# Patient Record
Sex: Male | Born: 1995 | Race: White | Hispanic: No | Marital: Single | Smoking: Never smoker
Health system: Southern US, Community
[De-identification: ages and names within clinical notes are randomized; demographics above are authoritative.]

---

## 2020-02-06 ENCOUNTER — Other Ambulatory Visit: Payer: Self-pay

## 2020-02-06 ENCOUNTER — Encounter (HOSPITAL_COMMUNITY): Payer: Self-pay | Admitting: Behavioral Health

## 2020-02-06 ENCOUNTER — Inpatient Hospital Stay (HOSPITAL_COMMUNITY)
Admission: RE | Admit: 2020-02-06 | Discharge: 2020-02-09 | DRG: 885 | Disposition: A | Discharge status: Home / Self Care | Payer: Medicaid Other | Attending: Psychiatry | Admitting: Psychiatry

## 2020-02-06 DIAGNOSIS — F259 Schizoaffective disorder, unspecified: Secondary | ICD-10-CM | POA: Diagnosis present

## 2020-02-06 DIAGNOSIS — Z20822 Contact with and (suspected) exposure to covid-19: Secondary | ICD-10-CM | POA: Diagnosis present

## 2020-02-06 DIAGNOSIS — B1920 Unspecified viral hepatitis C without hepatic coma: Secondary | ICD-10-CM | POA: Diagnosis present

## 2020-02-06 DIAGNOSIS — R45851 Suicidal ideations: Secondary | ICD-10-CM | POA: Diagnosis present

## 2020-02-06 DIAGNOSIS — R7401 Elevation of levels of liver transaminase levels: Secondary | ICD-10-CM

## 2020-02-06 DIAGNOSIS — Z886 Allergy status to analgesic agent status: Secondary | ICD-10-CM

## 2020-02-06 DIAGNOSIS — F332 Major depressive disorder, recurrent severe without psychotic features: Principal | ICD-10-CM | POA: Diagnosis present

## 2020-02-06 DIAGNOSIS — G4700 Insomnia, unspecified: Secondary | ICD-10-CM | POA: Diagnosis present

## 2020-02-06 LAB — RESPIRATORY PANEL BY RT PCR (FLU A&B, COVID)
Influenza A by PCR: NEGATIVE
Influenza B by PCR: NEGATIVE
SARS Coronavirus 2 by RT PCR: NEGATIVE

## 2020-02-06 MED ORDER — ACETAMINOPHEN 325 MG PO TABS: 650.0000 mg | ORAL_TABLET | Freq: Four times a day (QID) | ORAL | Status: DC | PRN

## 2020-02-06 MED ORDER — TRAZODONE HCL 50 MG PO TABS: 50.0000 mg | ORAL_TABLET | Freq: Every evening | ORAL | Status: DC | PRN

## 2020-02-06 MED ORDER — ALUM & MAG HYDROXIDE-SIMETH 200-200-20 MG/5ML PO SUSP: 30.0000 mL | ORAL | Status: DC | PRN

## 2020-02-06 MED ORDER — OLANZAPINE 10 MG PO TABS
20.0000 mg | ORAL_TABLET | Freq: Every day | ORAL | Status: DC
  Administered 2020-02-06: 20 mg via ORAL
  Filled 2020-02-06 (×3): qty 2

## 2020-02-06 MED ORDER — HYDROXYZINE HCL 50 MG PO TABS
50.0000 mg | ORAL_TABLET | Freq: Once | ORAL | Status: AC
  Administered 2020-02-06: 22:00:00 50 mg via ORAL

## 2020-02-06 MED ORDER — MIRTAZAPINE 15 MG PO TABS
15.0000 mg | ORAL_TABLET | Freq: Every day | ORAL | Status: DC
  Administered 2020-02-06: 15 mg via ORAL
  Filled 2020-02-06 (×3): qty 1

## 2020-02-06 MED ORDER — MAGNESIUM HYDROXIDE 400 MG/5ML PO SUSP: 30.0000 mL | Freq: Every day | ORAL | Status: DC | PRN

## 2020-02-06 MED ORDER — HYDROXYZINE HCL 25 MG PO TABS: 25.0000 mg | ORAL_TABLET | Freq: Four times a day (QID) | ORAL | Status: DC | PRN

## 2020-02-06 MED ORDER — BUSPIRONE HCL 5 MG PO TABS
5.0000 mg | ORAL_TABLET | Freq: Every morning | ORAL | Status: DC
  Administered 2020-02-07: 5 mg via ORAL
  Filled 2020-02-06 (×4): qty 1
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
## 2020-02-06 NOTE — Progress Notes (Signed)
Patient ID: Larry Donovan, male   DOB: 06-25-1996, 24 y.o.   MRN: [id number] Pt A&O x 4, presents with SI, plan to overdose on Heroin.  Complaint of anxiety and racing thoughts also.  Positive AVH noted, seeing dark shadows and radio like voices.  Skin search completed, monitoring for safety.  Pt has a ankle monitor intact, currently living at the [facility]  Pt anxious and cooperative.  No distress noted.
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
## 2020-02-07 DIAGNOSIS — G4700 Insomnia, unspecified: Secondary | ICD-10-CM | POA: Diagnosis present

## 2020-02-07 DIAGNOSIS — F333 Major depressive disorder, recurrent, severe with psychotic symptoms: Secondary | ICD-10-CM | POA: Diagnosis not present

## 2020-02-07 DIAGNOSIS — R45851 Suicidal ideations: Secondary | ICD-10-CM | POA: Diagnosis present

## 2020-02-07 DIAGNOSIS — Z886 Allergy status to analgesic agent status: Secondary | ICD-10-CM | POA: Diagnosis not present

## 2020-02-07 DIAGNOSIS — F259 Schizoaffective disorder, unspecified: Secondary | ICD-10-CM | POA: Diagnosis present

## 2020-02-07 DIAGNOSIS — Z20822 Contact with and (suspected) exposure to covid-19: Secondary | ICD-10-CM | POA: Diagnosis present

## 2020-02-07 DIAGNOSIS — F250 Schizoaffective disorder, bipolar type: Secondary | ICD-10-CM | POA: Diagnosis not present

## 2020-02-07 DIAGNOSIS — B1920 Unspecified viral hepatitis C without hepatic coma: Secondary | ICD-10-CM | POA: Diagnosis present

## 2020-02-07 DIAGNOSIS — F332 Major depressive disorder, recurrent severe without psychotic features: Secondary | ICD-10-CM | POA: Diagnosis present

## 2020-02-07 LAB — COMPREHENSIVE METABOLIC PANEL
ALT: 2144 U/L — ABNORMAL HIGH (ref 0–44)
AST: 1249 U/L — ABNORMAL HIGH (ref 15–41)
Albumin: 4.2 g/dL (ref 3.5–5.0)
Alkaline Phosphatase: 187 U/L — ABNORMAL HIGH (ref 38–126)
Anion gap: 9 (ref 5–15)
BUN: 16 mg/dL (ref 6–20)
CO2: 23 mmol/L (ref 22–32)
Calcium: 9.5 mg/dL (ref 8.9–10.3)
Chloride: 110 mmol/L (ref 98–111)
Creatinine, Ser: 0.61 mg/dL (ref 0.61–1.24)
GFR calc Af Amer: >60 mL/min (ref >60)
GFR calc non Af Amer: >60 mL/min (ref >60)
Glucose, Bld: 102 mg/dL — ABNORMAL HIGH (ref 70–99)
Potassium: 4.2 mmol/L (ref 3.5–5.1)
Sodium: 142 mmol/L (ref 135–145)
Total Bilirubin: 1.4 mg/dL — ABNORMAL HIGH (ref 0.3–1.2)
Total Protein: 7.4 g/dL (ref 6.5–8.1)

## 2020-02-07 LAB — CBC
HCT: 45.9 % (ref 39.0–52.0)
Hemoglobin: 15.2 g/dL (ref 13.0–17.0)
MCH: 31.3 pg (ref 26.0–34.0)
MCHC: 33.1 g/dL (ref 30.0–36.0)
MCV: 94.6 fL (ref 80.0–100.0)
Platelets: 236 10*3/uL (ref 150–400)
RBC: 4.85 MIL/uL (ref 4.22–5.81)
RDW: 12 % (ref 11.5–15.5)
WBC: 5.8 10*3/uL (ref 4.0–10.5)
nRBC: 0 % (ref 0.0–0.2)

## 2020-02-07 MED ORDER — LORAZEPAM 1 MG PO TABS: 1.0000 mg | ORAL_TABLET | ORAL | Status: DC | PRN

## 2020-02-07 MED ORDER — OLANZAPINE 5 MG PO TBDP: 5.0000 mg | ORAL_TABLET | Freq: Three times a day (TID) | ORAL | Status: DC | PRN

## 2020-02-07 NOTE — Progress Notes (Signed)
Pt reports ongoing depression and anxiety today. He endorses AVH, seeing figures and hearing random voices.  He endorses passive SI but denies having active SI during the assessment. He is able to contract for safety and agrees to speak with staff if he begins feeling suicidal. Pt denies HI.   Orders reviewed. V/s assessed. 15 minute checks performed for safety. Pt encouraged to attend groups.   Pt compliant with tx plan.

## 2020-02-07 NOTE — Progress Notes (Signed)
Pt made aware he's NPO after midnight tonight.  Pt refused labs this evening.

## 2020-02-07 NOTE — Progress Notes (Signed)
Patient oriented to adult unit after height, weight and belongings search. Patient was polite but sleepy. Safety maintained with 15 min checks.

## 2020-02-07 NOTE — [facility] Suicide Risk Assessment (Addendum)
[facility] Admission Suicide Risk Assessment   Nursing information obtained from:  Patient, Review of record Demographic factors:  Male, Caucasian, Adolescent or young adult Current Mental Status:  Suicidal ideation indicated by patient Loss Factors:  Legal issues Historical Factors:  Prior suicide attempts, Impulsivity Risk Reduction Factors:  Positive social support, Employed, Positive therapeutic relationship, Sense of responsibility to family, Living with another person, especially a relative, Positive coping skills or problem solving skills  Total Time spent with patient: 45 minutes Principal Problem: Schizoaffective Disorder  Diagnosis:  Active Problems:   Schizoaffective disorder ([place])   MDD (major depressive disorder), recurrent episode, severe ([place])  Subjective Data:  Continued Clinical Symptoms:  Alcohol Use Disorder Identification Test Final Score (AUDIT): 0 The "Alcohol Use Disorders Identification Test", Guidelines for Use in Primary Care, Second Edition.  World Pharmacologist [facility]). Score between 0-7:  no or low risk or alcohol related problems. Score between 8-15:  moderate risk of alcohol related problems. Score between 16-19:  high risk of alcohol related problems. Score 20 or above:  warrants further diagnostic evaluation for alcohol dependence and treatment.   CLINICAL FACTORS:  23, single, currently in an [place]  , has a 1 year old child. Works for a Runner, broadcasting/film/video. Reports he was recently released from jail ( Feb 25 )  He presented to [facility] on 4/2 via [facility], which he states he had contacted himself . He reports he had been feeling depressed and had developed suicidal ideations of overdosing on medications. He also reports auditory hallucinations ( reports voices which he cannot make out but which he feels are talking about him) , he also reports seeing shadows .  Describes depression and hallucinations have been chronic, but seem to have exacerbated over the last  few days, particularly yesterday. He reports he has been " under a lot of stress" related to job and to not being able to see his child. Endorses neuro-vegetative symptoms - poor energy, hypersomnia, low motivation, anhedonia Reports history of psychiatric illness and states he has been diagnosed with Schizoaffective Disorder. Reports past history of suicide attempt by overdosing on Heroin. Denies history of self cutting.  Denies medical illnesses. Allergic to Ibuprofen.  Reports he had been prescribed Zyprexa ( 20 mgrs QDAY), Remeron ( 15 mgrs QHS) , Buspar ( 5 mgrs BID). States he has been on these medications for about 8 months, without side effects, but had been off them for about two weeks.  History of opiate ( heroin) dependence, but reports he has not relapsed , last used about a year ago. Denies alcohol or other drug abuse .  * Labs reviewed. Of note AST and ALT as quite elevated ( 1,249 and 2,144) . Total Bili 1.4 CBC unremarkable.  I reviewed this issue with patient- denies known history of hepatic illness . Denies recent use or abuse or acetaminophen, denies any recent overdose . Denies Alcohol Abuse. Other than some choluria, does not endorse other associated symptoms- no acholia, no RUQ or abdominal pain reported at this time, no vomiting, and does not appear jaundiced or toxic . He is afebrile . I have reviewed with hospitalist consultant . Recommends RUQ sonogram and Viral Hepatitis Panel   Dx-  Schizoaffective Disorder . 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  *It is unlikely that his psychiatric medications ( Zyprexa, Remeron, Buspar ) are causing or contributing to elevated transaminases, and  states he has been taking these medications for several months now  without side effects . However , Zyprexa, Remeron can ( rarely) be associated with hepatotoxicity.  Will therefore hold standing medications for now. Zyprexa/Ativan PRN for anxiety or agitation .   RUQ sonogram Viral Hepatitis Panel - patient also agrees to HIV test Repeat Hepatic Function Test in AM    Musculoskeletal: Strength & Muscle Tone: within normal limits Gait & Station: normal Patient leans: N/A  Psychiatric Specialty Exam: Physical Exam  Review of Systems reports headache, no chest pain, no shortness of breath, no vomiting, no rash .  Reports some choluria, but no acholia, no abdominal pain, no vomiting   Blood pressure 128/79, pulse (!) 135, temperature 98.4 F (36.9 C), temperature source Oral, resp. rate 20, height 5\' 10"  (1.778 m), weight 76.2 kg, SpO2 99 %.Body mass index is 24.11 kg/m.  General Appearance: Fairly Groomed  Eye Contact:  Good  Speech:  Normal Rate  Volume:  Normal  Mood:  Depressed describes mood as 3/10 withy 10 being best  Affect:  constricted , slightly anxious   Thought Process:  Linear and Descriptions of Associations: Intact  Orientation:  Full (Time, Place, and Person)  Thought Content:  reports auditory and visual hallucinations, does not currently present internally preoccupied  No delusions are expressed   Suicidal Thoughts:  No denies current suicidal or self injurious ideations, contracts for safety on the unit,   Homicidal Thoughts:  No  Memory:  recent and remote grossly intact   Judgement:  Fair  Insight:  Fair  Psychomotor Activity:  Normal- no tremors, no diaphoresis, no restlessness or agitation  Concentration:  Concentration: Good and Attention Span: Good  Recall:  Good  Fund of Knowledge:  Good  Language:  Good  Akathisia:  Negative  Handed:  Right  AIMS (if indicated):   no abnormal or involuntary movements noted or reported . No cog wheeling or stiffness noted   Assets:  Communication Skills Desire for Improvement Resilience  ADL's:  Intact  Cognition:  WNL  Sleep:  Number of Hours: 2.75      COGNITIVE FEATURES THAT CONTRIBUTE TO RISK:  Closed-mindedness and Loss of executive function    SUICIDE RISK:    Moderate:  Frequent suicidal ideation with limited intensity, and duration, some specificity in terms of plans, no associated intent, good self-control, limited dysphoria/symptomatology, some risk factors present, and identifiable protective factors, including available and accessible social support.  PLAN OF CARE: Patient will be admitted to inpatient psychiatric unit for stabilization and safety. Will provide and encourage milieu participation. Provide medication management and maked adjustments as needed.  Will follow daily.    I certify that inpatient services furnished can reasonably be expected to improve the patient's condition.   , MD 02/07/2020, 1:56 PM

## 2020-02-08 ENCOUNTER — Inpatient Hospital Stay (HOSPITAL_COMMUNITY): Payer: Medicaid Other

## 2020-02-08 DIAGNOSIS — F333 Major depressive disorder, recurrent, severe with psychotic symptoms: Secondary | ICD-10-CM

## 2020-02-08 LAB — HEPATIC FUNCTION PANEL
ALT: 2100 U/L — ABNORMAL HIGH (ref 0–44)
AST: 882 U/L — ABNORMAL HIGH (ref 15–41)
Albumin: 4.2 g/dL (ref 3.5–5.0)
Alkaline Phosphatase: 204 U/L — ABNORMAL HIGH (ref 38–126)
Bilirubin, Direct: 0.5 mg/dL — ABNORMAL HIGH (ref 0.0–0.2)
Indirect Bilirubin: 0.7 mg/dL (ref 0.3–0.9)
Total Bilirubin: 1.2 mg/dL (ref 0.3–1.2)
Total Protein: 7.6 g/dL (ref 6.5–8.1)

## 2020-02-08 LAB — HEPATITIS PANEL, ACUTE
HCV Ab: REACTIVE — AB
Hep A IgM: NONREACTIVE
Hep B C IgM: NONREACTIVE
Hepatitis B Surface Ag: NONREACTIVE

## 2020-02-08 LAB — TSH: TSH: 0.576 u[IU]/mL (ref 0.350–4.500)

## 2020-02-08 MED ORDER — OLANZAPINE 10 MG PO TABS
10.0000 mg | ORAL_TABLET | Freq: Every day | ORAL | Status: DC
  Administered 2020-02-08: 21:00:00 10 mg via ORAL
  Filled 2020-02-08 (×3): qty 1

## 2020-02-08 MED ORDER — NICOTINE 21 MG/24HR TD PT24
21.0000 mg | MEDICATED_PATCH | Freq: Every day | TRANSDERMAL | Status: DC
  Administered 2020-02-08: 15:00:00 21 mg via TRANSDERMAL
  Filled 2020-02-08 (×4): qty 1

## 2020-02-08 MED ORDER — MIRTAZAPINE 15 MG PO TABS
15.0000 mg | ORAL_TABLET | Freq: Every day | ORAL | Status: DC
  Administered 2020-02-08: 15 mg via ORAL
  Filled 2020-02-08 (×3): qty 1

## 2020-02-08 NOTE — Progress Notes (Signed)
   02/07/20 2200  COVID-19 Daily Checkoff  Have you had a fever (temp > 37.80C/100F)  in the past 24 hours?  No  If you have had runny nose, nasal congestion, sneezing in the past 24 hours, has it worsened? No  COVID-19 EXPOSURE  Have you traveled outside the state in the past 14 days? No  Have you been in contact with someone with a confirmed diagnosis of COVID-19 or PUI in the past 14 days without wearing appropriate PPE? No  Have you been living in the same home as a person with confirmed diagnosis of COVID-19 or a PUI (household contact)? No  Have you been diagnosed with COVID-19? No

## 2020-02-08 NOTE — Progress Notes (Signed)
Patient has been observed up in the dayroom with peers laughing, talking and watching tv. He has been up and active more tonight. He was informed of his medications for tonight and inquired about vistaril because it helps him rest better. Writer suggested that he speak with his doctor if medicines prescribed don't work. Safety maintained with 15 min checks.

## 2020-02-08 NOTE — Progress Notes (Signed)
   02/08/20 0900  Psych Admission Type (Psych Patients Only)  Admission Status Voluntary  Psychosocial Assessment  Patient Complaints Anxiety  Eye Contact Fair  Facial Expression Anxious  Affect Anxious  Speech Logical/coherent  Interaction Assertive  Motor Activity Slow  Appearance/Hygiene Unremarkable  Behavior Characteristics Appropriate to situation;Anxious  Mood Anxious  Thought Process  Coherency WDL  Content WDL  Delusions None reported or observed  Perception Hallucinations  Hallucination Auditory;Visual  Judgment WDL  Confusion None  Danger to Self  Current suicidal ideation? Passive  Self-Injurious Behavior No self-injurious ideation or behavior indicators observed or expressed   Danger to Others  Danger to Others None reported or observed

## 2020-02-08 NOTE — [facility] Suicide Risk Assessment (Signed)
[facility] INPATIENT:  Family/Significant Other Suicide Prevention Education  Suicide Prevention Education:  Education Completed; mother Festus Barren [phone],  (name of family member/significant other) has been identified by the patient as the family member/significant other with whom the patient will be residing, and identified as the person(s) who will aid the patient in the event of a mental health crisis (suicidal ideations/suicide attempt).  With written consent from the patient, the family member/significant other has been provided the following suicide prevention education, prior to and/or following the discharge of the patient.  Mother was not aware that patient is in the hospital, despite having talked with him by phone this morning.  He gave CSW written consent to talk with her and knew this phone call would be made as well.  Mother stated that patient has Schizoaffective disorder, a mood disorder, and anxiety, just as she does.  She has been telling him for years to take his medications and to keep going back to his doctor until he is put on the right medicines that work.  She states she tells him:   "Nothing will make it go away but we can get a grip on the symptoms if we get on the proper medications."   She herself takes Haldol, Hyroxyzine, Xanax, and Invega.  Patient is not allowed at mother's apartment complex because of his past behavior there.  This is the first time everyone in his family has separated themselves from him because of his behaviors.  He has overdosed on heroin 2 times.  She is glad to hear that he told CSW he is clean at this time.  She is proud of him for being in the hospital.   The suicide prevention education provided includes the following:  Suicide risk factors  Suicide prevention and interventions  National Suicide Hotline telephone number  [facility] assessment telephone number  [facility] Emergency Assistance 911  [facility]  and/or Residential Mobile Crisis Unit telephone number  Request made of family/significant other to:  Remove weapons (e.g., guns, rifles, knives), all items previously/currently identified as safety concern.    Remove drugs/medications (over-the-counter, prescriptions, illicit drugs), all items previously/currently identified as a safety concern.  The family member/significant other verbalizes understanding of the suicide prevention education information provided.  The family member/significant other agrees to remove the items of safety concern listed above.  Carloyn Jaeger Grossman-Orr 02/08/2020, 2:16 PM

## 2020-02-08 NOTE — Progress Notes (Signed)
[facility] MD Progress Note  02/08/2020 10:07 AM Larry Donovan  MRN:  [id number] Subjective:  "I'm better than I was."  Larry Donovan found lying in bed. He reports mood is improved from yesterday but continues to report depressed mood with suicidal thoughts with no plan. He reports AH of voices that are unintelligible but later reports hearing CAH to kill himself. He denie sHI. He reports feeling "tired and restless." He denies withdrawal symptoms and denies recent drug/alcohol use. UDS was not obtained on admission. He has history of heroin use but states he has been sober for months. He reports poor sleep overnight related to being off medications. Nursing staff reports he slept throughout the night. Medications were discontinued yesterday due to elevated LFTs. LFTs trending down today, with AST now 882, ALT 2100. Hepatitis panel pending. Liver ultrasound done this morning shows gallbladder mildly contracted, no gallstones or pericholecystic fluid, with normal appearing liver by ultrasound.  From admission H&P: He presented to [facility] on 4/2 via [employer], which he states he had contacted himself . He reports he had been feeling depressed and had developed suicidal ideations of overdosing on medications. He also reports auditory hallucinations ( reports voices which he cannot make out but which he feels are talking about him) , he also reports seeing shadows .   Principal Problem: <principal problem not specified> Diagnosis: Active Problems:   Schizoaffective disorder ([facility])   MDD (major depressive disorder), recurrent episode, severe ([facility])  Total Time spent with patient: 15 minutes  Past Psychiatric History: See admission H&P  Past Medical History: History reviewed. No pertinent past medical history. History reviewed. No pertinent surgical history. Family History: History reviewed. No pertinent family history. Family Psychiatric  History: See admission H&P Social History:  Social History   Substance and  Sexual Activity  Alcohol Use None     Social History   Substance and Sexual Activity  Drug Use Not on file    Social History   Socioeconomic History  . Marital status: Single    Spouse name: Not on file  . Number of children: Not on file  . Years of education: Not on file  . Highest education level: Not on file  Occupational History  . Not on file  Tobacco Use  . Smoking status: Never Smoker  . Smokeless tobacco: Never Used  Substance and Sexual Activity  . Alcohol use: Not on file  . Drug use: Not on file  . Sexual activity: Not on file  Other Topics Concern  . Not on file  Social History Narrative  . Not on file   Social Determinants of Health   Financial Resource Strain:   . Difficulty of Paying Living Expenses:   Food Insecurity:   . Worried About Programme researcher, broadcasting/film/video in the Last Year:   . Barista in the Last Year:   Transportation Needs:   . Freight forwarder (Medical):   Marland Kitchen Lack of Transportation (Non-Medical):   Physical Activity:   . Days of Exercise per Week:   . Minutes of Exercise per Session:   Stress:   . Feeling of Stress :   Social Connections:   . Frequency of Communication with Friends and Family:   . Frequency of Social Gatherings with Friends and Family:   . Attends Religious Services:   . Active Member of Clubs or Organizations:   . Attends Banker Meetings:   Marland Kitchen Marital Status:    Additional Social History:  Pain Medications: Denies abuse Prescriptions: Denies abuse Over the Counter: Denies abuse History of alcohol / drug use?: Yes(Pt has history of abusing opiates.) Longest period of sobriety (when/how long): Unknown                    Sleep: Fair  Appetite:  Good  Current Medications: Current Facility-Administered Medications  Medication Dose Route Frequency Provider Last Rate Last Admin  . alum & mag hydroxide-simeth (MAALOX/MYLANTA) 200-200-20 MG/5ML suspension 30 mL  30 mL Oral Q4H PRN  Gillermo Murdoch, NP      . OLANZapine zydis (ZYPREXA) disintegrating tablet 5 mg  5 mg Oral Q8H PRN Cobos, Rockey Situ, MD       And  . LORazepam (ATIVAN) tablet 1 mg  1 mg Oral PRN Cobos, Rockey Situ, MD      . magnesium hydroxide (MILK OF MAGNESIA) suspension 30 mL  30 mL Oral Daily PRN Gillermo Murdoch, NP        Lab Results:  Results for orders placed or performed during the hospital encounter of 02/06/20 (from the past 48 hour(s))  Respiratory Panel by RT PCR (Flu A&B, Covid) - Nasopharyngeal Swab     Status: None   Collection Time: 02/06/20  9:44 PM   Specimen: Nasopharyngeal Swab  Result Value Ref Range   SARS Coronavirus 2 by RT PCR NEGATIVE NEGATIVE    Comment: (NOTE) SARS-CoV-2 target nucleic acids are NOT DETECTED. The SARS-CoV-2 RNA is generally detectable in upper respiratoy specimens during the acute phase of infection. The lowest concentration of SARS-CoV-2 viral copies this assay can detect is 131 copies/mL. A negative result does not preclude SARS-Cov-2 infection and should not be used as the sole basis for treatment or other patient management decisions. A negative result may occur with  improper specimen collection/handling, submission of specimen other than nasopharyngeal swab, presence of viral mutation(s) within the areas targeted by this assay, and inadequate number of viral copies (<131 copies/mL). A negative result must be combined with clinical observations, patient history, and epidemiological information. The expected result is Negative. Fact Sheet for Patients:  https://www.moore.com/ Fact Sheet for Healthcare Providers:  https://www.young.biz/ This test is not yet ap proved or cleared by the [place] FDA and  has been authorized for detection and/or diagnosis of SARS-CoV-2 by FDA under an Emergency Use Authorization (EUA). This EUA will remain  in effect (meaning this test can be used) for the duration  of the COVID-19 declaration under Section 564(b)(1) of the Act, 21 U.S.C. section 360bbb-3(b)(1), unless the authorization is terminated or revoked sooner.    Influenza A by PCR NEGATIVE NEGATIVE   Influenza B by PCR NEGATIVE NEGATIVE    Comment: (NOTE) The Xpert Xpress SARS-CoV-2/FLU/RSV assay is intended as an aid in  the diagnosis of influenza from Nasopharyngeal swab specimens and  should not be used as a sole basis for treatment. Nasal washings and  aspirates are unacceptable for Xpert Xpress SARS-CoV-2/FLU/RSV  testing. Fact Sheet for Patients: https://www.moore.com/ Fact Sheet for Healthcare Providers: https://www.young.biz/ This test is not yet approved or cleared by the [place] FDA and  has been authorized for detection and/or diagnosis of SARS-CoV-2 by  FDA under an Emergency Use Authorization (EUA). This EUA will remain  in effect (meaning this test can be used) for the duration of the  Covid-19 declaration under Section 564(b)(1) of the Act, 21  U.S.C. section 360bbb-3(b)(1), unless the authorization is  terminated or revoked. Performed at [facility], [address] [place], [place] [address]   CBC     Status: None   Collection Time: 02/07/20  6:20 AM  Result Value Ref Range   WBC 5.8 4.0 - 10.5 K/uL   RBC 4.85 4.22 - 5.81 MIL/uL   Hemoglobin 15.2 13.0 - 17.0 g/dL   HCT 99.8 33.8 - 25.0 %   MCV 94.6 80.0 - 100.0 fL   MCH 31.3 26.0 - 34.0 pg   MCHC 33.1 30.0 - 36.0 g/dL   RDW 53.9 76.7 - 34.1 %   Platelets 236 150 - 400 K/uL   nRBC 0.0 0.0 - 0.2 %    Comment: Performed at [facility], [address] [place], [place] [address]  Comprehensive metabolic panel     Status: Abnormal   Collection Time: 02/07/20  6:20 AM  Result Value Ref Range   Sodium 142 135 - 145 mmol/L   Potassium 4.2 3.5 - 5.1 mmol/L   Chloride 110 98 - 111 mmol/L   CO2 23 22 - 32 mmol/L   Glucose, Bld 102  (H) 70 - 99 mg/dL    Comment: Glucose reference range applies only to samples taken after fasting for at least 8 hours.   BUN 16 6 - 20 mg/dL   Creatinine, Ser 2.40 0.61 - 1.24 mg/dL   Calcium 9.5 8.9 - 97.3 mg/dL   Total Protein 7.4 6.5 - 8.1 g/dL   Albumin 4.2 3.5 - 5.0 g/dL   AST 5,329 (H) 15 - 41 U/L   ALT 2,144 (H) 0 - 44 U/L   Alkaline Phosphatase 187 (H) 38 - 126 U/L   Total Bilirubin 1.4 (H) 0.3 - 1.2 mg/dL   GFR calc non Af Amer >60 >60 mL/min   GFR calc Af Amer >60 >60 mL/min   Anion gap 9 5 - 15    Comment: Performed at [facility], [address] [place], [place] [address]  Hepatic function panel     Status: Abnormal   Collection Time: 02/08/20  6:36 AM  Result Value Ref Range   Total Protein 7.6 6.5 - 8.1 g/dL   Albumin 4.2 3.5 - 5.0 g/dL   AST 834 (H) 15 - 41 U/L   ALT 2,100 (H) 0 - 44 U/L   Alkaline Phosphatase 204 (H) 38 - 126 U/L   Total Bilirubin 1.2 0.3 - 1.2 mg/dL   Bilirubin, Direct 0.5 (H) 0.0 - 0.2 mg/dL   Indirect Bilirubin 0.7 0.3 - 0.9 mg/dL    Comment: Performed at [facility], [address] [place], [place] [address]  TSH     Status: None   Collection Time: 02/08/20  6:36 AM  Result Value Ref Range   TSH 0.576 0.350 - 4.500 uIU/mL    Comment: Performed by a 3rd Generation assay with a functional sensitivity of <=0.01 uIU/mL. Performed at [facility], [address], [place], [place] [address]     Blood Alcohol level:  No results found for: [facility]  Metabolic Disorder Labs: No results found for: HGBA1C, MPG No results found for: PROLACTIN No results found for: CHOL, TRIG, HDL, CHOLHDL, VLDL, LDLCALC  Physical Findings: AIMS: Facial and Oral Movements Muscles of Facial Expression: None, normal Lips and Perioral Area: None, normal Jaw: None, normal Tongue: None, normal,Extremity Movements Upper (arms, wrists, hands, fingers): None, normal Lower (legs, knees, ankles, toes): None,  normal, Trunk Movements Neck, shoulders, hips: None, normal, Overall Severity Severity of abnormal movements (highest  score from questions above): None, normal Incapacitation due to abnormal movements: None, normal Patient's awareness of abnormal movements (rate only patient's report): No Awareness, Dental Status Current problems with teeth and/or dentures?: No Does patient usually wear dentures?: No  CIWA:  CIWA-Ar Total: 1 COWS:  COWS Total Score: 3  Musculoskeletal: Strength & Muscle Tone: within normal limits Gait & Station: normal Patient leans: N/A  Psychiatric Specialty Exam: Physical Exam  Nursing note and vitals reviewed. Constitutional: He is oriented to person, place, and time. He appears well-developed and well-nourished.  Respiratory: Effort normal.  Musculoskeletal:        General: Normal range of motion.  Neurological: He is alert and oriented to person, place, and time.    Review of Systems  Constitutional: Negative.   Respiratory: Negative for cough and shortness of breath.   Gastrointestinal: Negative for abdominal pain, constipation, diarrhea, nausea and vomiting.  Neurological: Negative for tremors and headaches.  Psychiatric/Behavioral: Positive for dysphoric mood, hallucinations and suicidal ideas. Negative for agitation, behavioral problems, self-injury and sleep disturbance. The patient is not nervous/anxious and is not hyperactive.     Blood pressure 104/86, pulse (!) 128, temperature 98.4 F (36.9 C), temperature source Oral, resp. rate 20, height 5\' 10"  (1.778 m), weight 76.2 kg, SpO2 99 %.Body mass index is 24.11 kg/m.  General Appearance: Casual  Eye Contact:  Good  Speech:  Normal Rate  Volume:  Normal  Mood:  Depressed  Affect:  Non-Congruent  Thought Process:  Coherent  Orientation:  Full (Time, Place, and Person)  Thought Content:  Logical  Suicidal Thoughts:  Yes.  without intent/plan  Homicidal Thoughts:  No  Memory:  Immediate;    Fair Recent;   Fair  Judgement:  Intact  Insight:  Fair  Psychomotor Activity:  Normal  Concentration:  Concentration: Fair and Attention Span: Fair  Recall:  [employer] of Knowledge:  Fair  Language:  Fair  Akathisia:  No  Handed:  Right  AIMS (if indicated):     Assets:  Communication Skills Desire for Improvement Resilience  ADL's:  Intact  Cognition:  WNL  Sleep:  Number of Hours: 6.75     Treatment Plan Summary: Daily contact with patient to assess and evaluate symptoms and progress in treatment and Medication management   Continue inpatient hospitalization.  Restart Zyprexa 10 mg PO QHS for mood/hallucinations Restart Remeron 15 mg PO QHS for mood/sleep Continue agitation protocol PRN agitation  Patient will participate in the therapeutic group milieu.  Discharge disposition in progress.   Connye Burkitt, NP 02/08/2020, 10:07 AM

## 2020-02-08 NOTE — Progress Notes (Signed)
Patient has been in his room asleep since shift change. Writer entered his room to check on him and inform him that snacks had been given out and if wanting snacks or drink before his NPO began later on tonight. Patient declined and reported that he is just really tired. Safety maintained with 15 min checks.
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
## 2020-02-09 DIAGNOSIS — F250 Schizoaffective disorder, bipolar type: Secondary | ICD-10-CM

## 2020-02-09 LAB — HIV-1 RNA QUANT-NO REFLEX-BLD
HIV 1 RNA Quant: <20 copies/mL
LOG10 HIV-1 RNA: UNDETERMINED log10copy/mL

## 2020-02-09 MED ORDER — OLANZAPINE 20 MG PO TABS: 20.0000 mg | ORAL_TABLET | Freq: Every day | ORAL | 1 refills | Status: AC

## 2020-02-09 NOTE — Progress Notes (Signed)
1:1 Nursing note - Patient is currently lying in bed asleep, no distress noted. Safety maintained with 15 min checks until patient wakes and will then be a 1:1.

## 2020-02-09 NOTE — Progress Notes (Signed)
   02/08/20 2200  COVID-19 Daily Checkoff  Have you had a fever (temp > 37.80C/100F)  in the past 24 hours?  No  If you have had runny nose, nasal congestion, sneezing in the past 24 hours, has it worsened? No  COVID-19 EXPOSURE  Have you traveled outside the state in the past 14 days? No  Have you been in contact with someone with a confirmed diagnosis of COVID-19 or PUI in the past 14 days without wearing appropriate PPE? No  Have you been living in the same home as a person with confirmed diagnosis of COVID-19 or a PUI (household contact)? No  Have you been diagnosed with COVID-19? No

## 2020-02-09 NOTE — BHH Suicide Risk Assessment (Signed)
[facility] Discharge Suicide Risk Assessment   Principal Problem: <principal problem not specified> Discharge Diagnoses: Active Problems:   Schizoaffective disorder (HCC)   MDD (major depressive disorder), recurrent episode, severe (HCC)   Total Time spent with patient: 45 minutes Musculoskeletal: Strength & Muscle Tone: within normal limits Gait & Station: normal Patient leans: N/A  Psychiatric Specialty Exam: Physical Exam  Review of Systems  Blood pressure 118/69, pulse (!) 120, temperature 98.2 F (36.8 C), temperature source Oral, resp. rate 20, height 5\' 10"  (1.778 m), weight 76.2 kg, SpO2 99 %.Body mass index is 24.11 kg/m.  General Appearance: Casual  Eye Contact:  Good  Speech:  Clear and Coherent  Volume:  Normal  Mood:  Euthymic  Affect:  Appropriate and Congruent  Thought Process:  Coherent and Goal Directed  Orientation:  Full (Time, Place, and Person)  Thought Content:  Logical  Suicidal Thoughts:  No  Homicidal Thoughts:  No  Memory:  Immediate;   Fair Recent;   Fair Remote;   Fair  Judgement:  Fair  Insight:  Fair  Psychomotor Activity:  Normal  Concentration:  Concentration: Fair and Attention Span: Fair  Recall:  of Knowledge:  Fair  Language:  Good  Akathisia:  Negative  Handed:  Right  AIMS (if indicated):     Assets:  Communication Skills Desire for Improvement  ADL's:  Intact  Cognition:  WNL  Sleep:  Number of Hours: 6.75     Mental Status Per Nursing Assessment::   On Admission:  Suicidal ideation indicated by patient  Demographic Factors:  Caucasian  Loss Factors: Decrease in vocational status  Historical Factors: Impulsivity  Risk Reduction Factors:   Sense of responsibility to family and Religious beliefs about death  Continued Clinical Symptoms:  Previous Psychiatric Diagnoses and Treatments  Cognitive Features That Contribute To Risk:  Polarized thinking    Suicide Risk:  Minimal: No identifiable suicidal  ideation.  Patients presenting with no risk factors but with morbid ruminations; may be classified as minimal risk based on the severity of the depressive symptoms  Follow-up Information    [facility] Follow up on 02/12/2020.   Why: Your hospital discharge appointment is scheduled for 02/12/20 at 10am. This appointment will be held by phone and the provider will call you. Contact information: [address] [place] [phone]          [phone], MD 02/09/2020, 11:15 AM

## 2020-02-09 NOTE — Tx Team (Signed)
Interdisciplinary Treatment and Diagnostic Plan Update  02/09/2020 Time of Session: 9:00am Larry Donovan MRN: [id number]  Principal Diagnosis: <principal problem not specified>  Secondary Diagnoses: Active Problems:   Schizoaffective disorder (HCC)   MDD (major depressive disorder), recurrent episode, severe ([place])   Current Medications:  Current Facility-Administered Medications  Medication Dose Route Frequency Provider Last Rate Last Admin  . alum & mag hydroxide-simeth (MAALOX/MYLANTA) 200-200-20 MG/5ML suspension 30 mL  30 mL Oral Q4H PRN Caroline Sauger, NP      . OLANZapine zydis (ZYPREXA) disintegrating tablet 5 mg  5 mg Oral Q8H PRN Cobos, Myer Peer, MD       And  . LORazepam (ATIVAN) tablet 1 mg  1 mg Oral PRN Cobos, Myer Peer, MD      . magnesium hydroxide (MILK OF MAGNESIA) suspension 30 mL  30 mL Oral Daily PRN Caroline Sauger, NP      . mirtazapine (REMERON) tablet 15 mg  15 mg Oral QHS Connye Burkitt, NP   15 mg at 02/08/20 2101  . nicotine (NICODERM CQ - dosed in mg/24 hours) patch 21 mg  21 mg Transdermal Daily Cobos, Myer Peer, MD   21 mg at 02/08/20 1518  . OLANZapine (ZYPREXA) tablet 10 mg  10 mg Oral QHS Connye Burkitt, NP   10 mg at 02/08/20 2101   PTA Medications: Medications Prior to Admission  Medication Sig Dispense Refill Last Dose  . acetaminophen (TYLENOL) 325 MG tablet Take 650 mg by mouth every 6 (six) hours as needed for mild pain or headache.     . busPIRone (BUSPAR) 5 MG tablet Take 5 mg by mouth 2 (two) times daily.     . mirtazapine (REMERON) 15 MG tablet Take 15 mg by mouth at bedtime.     Marland Kitchen OLANZapine (ZYPREXA) 20 MG tablet Take 20 mg by mouth at bedtime.       Patient Stressors:    Patient Strengths:    Treatment Modalities: Medication Management, Group therapy, Case management,  1 to 1 session with clinician, Psychoeducation, Recreational therapy.   Physician Treatment Plan for Primary Diagnosis: <principal problem not  specified> Long Term Goal(s): Improvement in symptoms so as ready for discharge Improvement in symptoms so as ready for discharge   Short Term Goals: Ability to identify changes in lifestyle to reduce recurrence of condition will improve Ability to verbalize feelings will improve Ability to disclose and discuss suicidal ideas Ability to demonstrate self-control will improve Ability to maintain clinical measurements within normal limits will improve Ability to identify changes in lifestyle to reduce recurrence of condition will improve Ability to verbalize feelings will improve Ability to disclose and discuss suicidal ideas Ability to demonstrate self-control will improve Ability to identify and develop effective coping behaviors will improve Ability to maintain clinical measurements within normal limits will improve  Medication Management: Evaluate patient's response, side effects, and tolerance of medication regimen.  Therapeutic Interventions: 1 to 1 sessions, Unit Group sessions and Medication administration.  Evaluation of Outcomes: Progressing  Physician Treatment Plan for Secondary Diagnosis: Active Problems:   Schizoaffective disorder ([place])   MDD (major depressive disorder), recurrent episode, severe ([place])  Long Term Goal(s): Improvement in symptoms so as ready for discharge Improvement in symptoms so as ready for discharge   Short Term Goals: Ability to identify changes in lifestyle to reduce recurrence of condition will improve Ability to verbalize feelings will improve Ability to disclose and discuss suicidal ideas Ability to demonstrate self-control will improve Ability to  maintain clinical measurements within normal limits will improve Ability to identify changes in lifestyle to reduce recurrence of condition will improve Ability to verbalize feelings will improve Ability to disclose and discuss suicidal ideas Ability to demonstrate self-control will improve Ability  to identify and develop effective coping behaviors will improve Ability to maintain clinical measurements within normal limits will improve     Medication Management: Evaluate patient's response, side effects, and tolerance of medication regimen.  Therapeutic Interventions: 1 to 1 sessions, Unit Group sessions and Medication administration.  Evaluation of Outcomes: Progressing   RN Treatment Plan for Primary Diagnosis: <principal problem not specified> Long Term Goal(s): Knowledge of disease and therapeutic regimen to maintain health will improve  Short Term Goals: Ability to verbalize feelings will improve, Ability to disclose and discuss suicidal ideas, Ability to identify and develop effective coping behaviors will improve and Compliance with prescribed medications will improve  Medication Management: RN will administer medications as ordered by provider, will assess and evaluate patient's response and provide education to patient for prescribed medication. RN will report any adverse and/or side effects to prescribing provider.  Therapeutic Interventions: 1 on 1 counseling sessions, Psychoeducation, Medication administration, Evaluate responses to treatment, Monitor vital signs and CBGs as ordered, Perform/monitor CIWA, COWS, AIMS and Fall Risk screenings as ordered, Perform wound care treatments as ordered.  Evaluation of Outcomes: Progressing   LCSW Treatment Plan for Primary Diagnosis: <principal problem not specified> Long Term Goal(s): Safe transition to appropriate next level of care at discharge, Engage patient in therapeutic group addressing interpersonal concerns.  Short Term Goals: Engage patient in aftercare planning with referrals and resources, Facilitate patient progression through stages of change regarding substance use diagnoses and concerns, Identify triggers associated with mental health/substance abuse issues and Increase skills for wellness and recovery  Therapeutic  Interventions: Assess for all discharge needs, 1 to 1 time with Social worker, Explore available resources and support systems, Assess for adequacy in community support network, Educate family and significant other(s) on suicide prevention, Complete Psychosocial Assessment, Interpersonal group therapy.  Evaluation of Outcomes: Progressing   Progress in Treatment: Attending groups: No. Participating in groups: No. Taking medication as prescribed: Yes. Toleration medication: Yes. Family/Significant other contact made: Yes, individual(s) contacted:  mother. Patient understands diagnosis: Yes. Discussing patient identified problems/goals with staff: Yes. Medical problems stabilized or resolved: Yes. Denies suicidal/homicidal ideation: Yes. Issues/concerns per patient self-inventory: Yes.  New problem(s) identified: Yes, Describe:  legal issues.  New Short Term/Long Term Goal(s): detox, medication management for mood stabilization; elimination of SI thoughts; development of comprehensive mental wellness/sobriety plan.  Patient Goals:   Discharge Plan or Barriers: Expects to return to his [facility] and will follow up with [facility] for outpatient.  Reason for Continuation of Hospitalization: Anxiety Depression Medication stabilization  Estimated Length of Stay: 1-2 days  Attendees: Patient: 02/09/2020 10:18 AM  Physician: Dr.Farah 02/09/2020 10:18 AM  Nursing:  02/09/2020 10:18 AM  RN Care Manager: 02/09/2020 10:18 AM  Social Worker: Enid Cutter, LCSWA 02/09/2020 10:18 AM  Recreational Therapist:  02/09/2020 10:18 AM  Other:  02/09/2020 10:18 AM  Other:  02/09/2020 10:18 AM  Other: 02/09/2020 10:18 AM    Scribe for Treatment Team: Darreld Mclean, LCSWA 02/09/2020 10:18 AM

## 2020-02-09 NOTE — Progress Notes (Signed)
  [facility] Adult Case Management Discharge Plan :  Will you be returning to the same living situation after discharge:  No. Going to a local hotel. At discharge, do you have transportation home?: Yes,  Safe Transportation/Lyft Do you have the ability to pay for your medications: Yes,  has Medicaid.  Release of information consent forms completed and in the chart.  Patient to Follow up at: Follow-up Information    [facility] Follow up on 02/12/2020.   Why: Your hospital discharge appointment is scheduled for 02/12/20 at 10am. This appointment will be held by phone and the provider will call you. Contact information: [address] [place] [address] [phone]           Next level of care provider has access to [facility] Link:no  Safety Planning and Suicide Prevention discussed: Yes,  with mother.  Has patient been referred to the Quitline?: Patient refused referral  Patient has been referred for addiction treatment: Yes  Darreld Mclean, LCSWA 02/09/2020, 11:23 AM

## 2020-02-09 NOTE — Progress Notes (Signed)
  [facility] Adult Case Management Discharge Plan :  Will you be returning to the same living situation after discharge:  No. Going to a motel. At discharge, do you have transportation home?: Yes,  Safe Transportation/Lyft. Do you have the ability to pay for your medications: Yes,  has Medicaid.  Release of information consent forms completed and in the chart;  Patient's signature needed at discharge.  Patient to Follow up at: Follow-up Information    [facility] Follow up on 02/12/2020.   Why: Your hospital discharge appointment is scheduled for 02/12/20 at 10am. This appointment will be held by phone and the provider will call you. Contact information: [address] [place] [address] [phone]           Next level of care provider has access to [facility] Link:no  Safety Planning and Suicide Prevention discussed: Yes,  with mother.  Has patient been referred to the Quitline?: N/A patient is not a smoker  Patient has been referred for addiction treatment: Yes  Darreld Mclean, LCSWA 02/09/2020, 10:58 AM

## 2020-02-09 NOTE — Progress Notes (Signed)
Pt discharged to lobby. Pt was stable and appreciative at that time. All papers and prescriptions were given and valuables returned. Verbal understanding expressed. Denies SI/HI and A/VH. Pt given opportunity to express concerns and ask questions.  

## 2020-02-09 NOTE — Progress Notes (Signed)
Recreation Therapy Notes  Date: 4.5.21 Time: 1000 Location: 500 [address] Dayroom  Group Topic: Coping Skills  Goal Area(s) Addresses:  Patient will identify positive coping strategies. Patient will identify benefit of using coping strategies post d/c.  Behavioral Response:  Minimal  Intervention: Worksheet  Activity: Healthy vs. Unhealthy Coping Strategies.  Patients were to identify a current problem they are currently dealing with.  Patients also had to identify unhealthy coping strategies and consequences of these coping strategies.  Lastly, patients identified healthy coping strategies, expected outcomes and barriers to using these coping strategies.  Education: Pharmacologist, Building control surveyor.   Education Outcome: Acknowledges understanding/In group clarification offered/Needs additional education.   Clinical Observations/Feedback: Pt identified current problem as being homeless.  Pt identified no unhealthy or healthy coping strategies in dealing with his homelessness.    Caroll Rancher, LRT/CTRS     Caroll Rancher A 02/09/2020 11:52 AM

## 2020-02-09 NOTE — Discharge Summary (Signed)
Physician Discharge Summary Note  Patient:  Larry Donovan is an 24 y.o., male MRN:  [id number] DOB:  10/10/1996 Patient phone:  [phone] (home)  Patient address:   [place] [address],  Total Time spent with patient: 45 minutes  Date of Admission:  02/06/2020 Date of Discharge: 02/09/2020  Reason for Admission:   History of Present Illness: Larry Donovan an 24 y.o.single male who presents to [facility] unaccompanied and voluntarily. The patient was transported to [facility] via Event organiser. The patient reports he was released from prison on 01/01/20. He has a diagnosis of schizoaffective disorder. The patient presents to be anxious, pacing a little, pressured speech; he has not taken his psychiatric medications today. He disclosed he had to leave work because he experienced severe anxiety, racing thoughts, and suicidal ideation. He reports thoughts of overdosing on medications which he has done in the past with heroin. The patient admits to experiencing depressive symptoms such as social withdrawal, decrease concentration, worthlessness, reduced sleep, loss of interest in usual pleasures, fatigue, and crying spells. 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 The patient states since he got out of prison, he has not used heroin.  On evaluation, he is alert and oriented x4, calm, cooperative, and mood-congruent with affect. The patient does not appear to be responding to internal or external stimuli. Neither is the patient presenting with any delusional thinking. The patient admits to auditory or visual hallucinations. He reports visual hallucinations of seeing "dark shadows" and hearing voices calling his name or "like voices on a radio." The patient admitted to suicidal ideation before coming to the hospital but denies homicidal or self-harm ideations. The patient is not presenting with any psychotic or paranoid behaviors. During an encounter  with the patient, he was able to answer questions appropriately  Principal Problem: Presenting with thoughts of suicide, hypomania and reporting visual hallucinations and auditory hallucinations as well, further diagnostics indicated hepatitis C Discharge Diagnoses: Active Problems:   Schizoaffective disorder (HCC)   MDD (major depressive disorder), recurrent episode, severe ([place])   Past Psychiatric History: As above history of substance abuse and history of schizoaffective by report  Past Medical History: History reviewed. No pertinent past medical history. History reviewed. No pertinent surgical history. Family History: History reviewed. No pertinent family history. Family Psychiatric  History: Patient denied Social History:  Social History   Substance and Sexual Activity  Alcohol Use None     Social History   Substance and Sexual Activity  Drug Use Not on file    Social History   Socioeconomic History  . Marital status: Single    Spouse name: Not on file  . Number of children: Not on file  . Years of education: Not on file  . Highest education level: Not on file  Occupational History  . Not on file  Tobacco Use  . Smoking status: Never Smoker  . Smokeless tobacco: Never Used  Substance and Sexual Activity  . Alcohol use: Not on file  . Drug use: Not on file  . Sexual activity: Not on file  Other Topics Concern  . Not on file  Social History Narrative  . Not on file   Social Determinants of Health   Financial Resource Strain:   . Difficulty of Paying Living Expenses:   Food Insecurity:   . Worried About Charity fundraiser in the Last Year:   . [place] in the Last Year:  Transportation Needs:   . Freight forwarder (Medical):   Marland Kitchen Lack of Transportation (Non-Medical):   Physical Activity:   . Days of Exercise per Week:   . Minutes of Exercise per Session:   Stress:   . Feeling of Stress :   Social Connections:   . Frequency of Communication  with Friends and Family:   . Frequency of Social Gatherings with Friends and Family:   . Attends Religious Services:   . Active Member of Clubs or Organizations:   . Attends Banker Meetings:   Marland Kitchen Marital Status:     Hospital Course:    Patient was admitted under routine precautions and displayed no dangerous behaviors here.  He was compliant with medications. As discussed, liver enzymes were highly elevated and hepatitis C screening was positive. Olanzapine was restarted, by the date of the fourth he was described as still reporting depressed mood, intermittent suicidal thoughts without plans and voices that were "unintelligible" then later reporting they were command hallucinations telling him to harm himself.  His liver enzymes were trending down.  By the date of the fifth the patient was somewhat intrusive rather insistent upon discharge.  Because he continually approached me requesting discharge I had the opportunity to do 3 separate mental status exams, each time he denied all positive symptoms such as hallucinations, he was cooperative again intrusive but redirectable, denied thoughts of harming self or others and understood what was to contract for safety.  We had continued olanzapine but I felt it best to discontinue all other medications due to his liver disease and further we felt it was important to get him follow-up with a GI doctor in order to treat his hepatitis C.  But by the date of the fifth he denied all positive symptoms all thoughts of harming self or others and could contract fully and again was insistent upon discharge.  We never got a drug screen he may be craving but the bottom line is he is not committable and is discharged home                                                   Physical Findings:                                                AIMS: Facial and Oral Movements Muscles of Facial Expression: None, normal Lips and Perioral Area: None,  normal Jaw: None, normal Tongue: None, normal,Extremity Movements Upper (arms, wrists, hands, fingers): None, normal Lower (legs, knees, ankles, toes): None, normal, Trunk Movements Neck, shoulders, hips: None, normal, Overall Severity Severity of abnormal movements (highest score from questions above): None, normal Incapacitation due to abnormal movements: None, normal Patient's awareness of abnormal movements (rate only patient's report): No Awareness, Dental Status Current problems with teeth and/or dentures?: No Does patient usually wear dentures?: No  CIWA:  CIWA-Ar Total: 1 COWS:  COWS Total Score: 3  Musculoskeletal: Strength & Muscle Tone: within normal limits Gait & Station: normal Patient leans: N/A  Psychiatric Specialty Exam: Physical Exam  Review of Systems  Blood pressure 118/69, pulse (!) 120, temperature 98.2 F (36.8 C), temperature source Oral, resp. rate 20, height 5\' 10"  (1.778  m), weight 76.2 kg, SpO2 99 %.Body mass index is 24.11 kg/m.  General Appearance: Casual  Eye Contact:  Good  Speech:  Clear and Coherent  Volume:  Normal  Mood:  Euthymic  Affect:  Appropriate and Congruent  Thought Process:  Coherent and Goal Directed  Orientation:  Full (Time, Place, and Person)  Thought Content:  Logical  Suicidal Thoughts:  No  Homicidal Thoughts:  No  Memory:  Immediate;   Fair Recent;   Fair Remote;   Fair  Judgement:  Fair  Insight:  Fair  Psychomotor Activity:  Normal  Concentration:  Concentration: Fair and Attention Span: Fair  Recall:  [employer] of Knowledge:  Fair  Language:  Good  Akathisia:  Negative  Handed:  Right  AIMS (if indicated):     Assets:  Communication Skills Desire for Improvement  ADL's:  Intact  Cognition:  WNL  Sleep:  Number of Hours: 6.75        Has this patient used any form of tobacco in the last 30 days? (Cigarettes, Smokeless Tobacco, Cigars, and/or Pipes) Yes, No  Blood Alcohol level:  No results found for:  [facility]  Metabolic Disorder Labs:  No results found for: HGBA1C, MPG No results found for: PROLACTIN No results found for: CHOL, TRIG, HDL, CHOLHDL, VLDL, LDLCALC  See Psychiatric Specialty Exam and Suicide Risk Assessment completed by Attending Physician prior to discharge.  Discharge destination:  Home  Is patient on multiple antipsychotic therapies at discharge:  No   Has Patient had three or more failed trials of antipsychotic monotherapy by history:  No  Recommended Plan for Multiple Antipsychotic Therapies: NA   Allergies as of 02/09/2020      Reactions   Ibuprofen Hives      Medication List    STOP taking these medications   acetaminophen 325 MG tablet Commonly known as: TYLENOL   busPIRone 5 MG tablet Commonly known as: BUSPAR   mirtazapine 15 MG tablet Commonly known as: REMERON     TAKE these medications     Indication  OLANZapine 20 MG tablet Commonly known as: ZYPREXA Take 1 tablet (20 mg total) by mouth at bedtime.  Indication: MIXED BIPOLAR AFFECTIVE DISORDER      Follow-up Information    [facility] Follow up on 02/12/2020.   Why: Your hospital discharge appointment is scheduled for 02/12/20 at 10am. This appointment will be held by phone and the provider will call you. Contact information: [address] [place] [phone]          SignedMalvin Johns, MD 02/09/2020, 10:58 AM

## 2021-04-01 IMAGING — US US ABDOMEN LIMITED
1 series · 14 of 25 positions shown · non-contrast
Comparison: None.

CLINICAL DATA: Elevated liver enzymes

EXAM:
ULTRASOUND ABDOMEN LIMITED RIGHT UPPER QUADRANT

[Series 1: us abdomen limited · 14 of 54 slices shown]
[im 1/54]
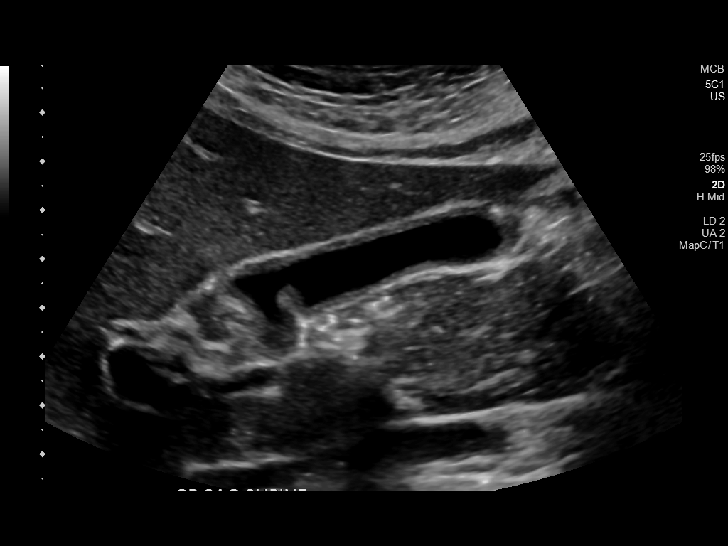
[im 5/54]
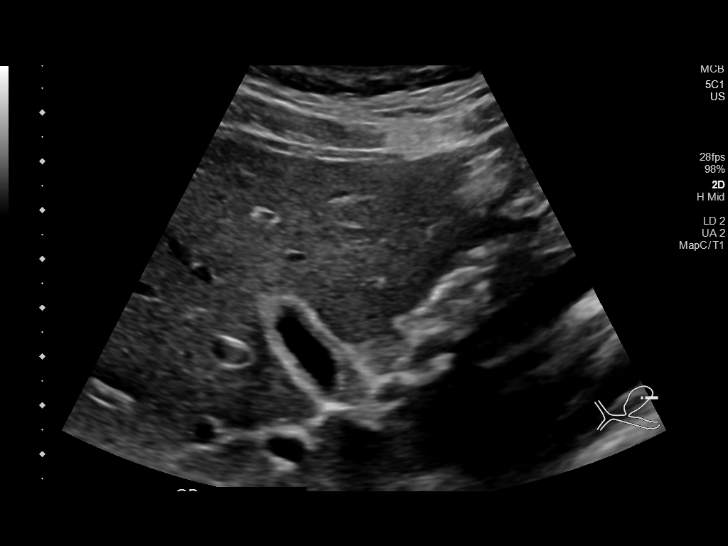
[im 9/54]
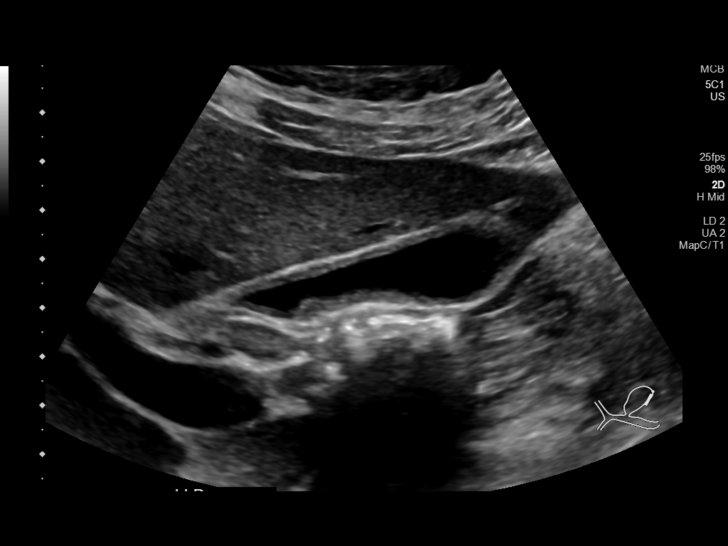
[im 14/54]
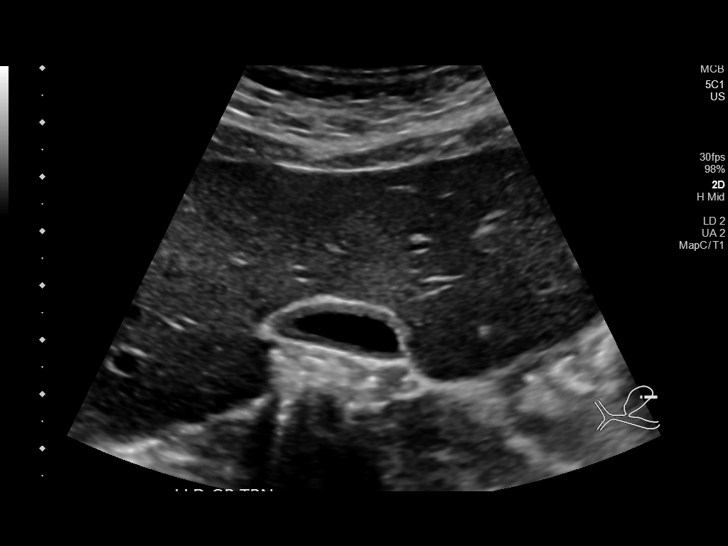
[im 18/54]
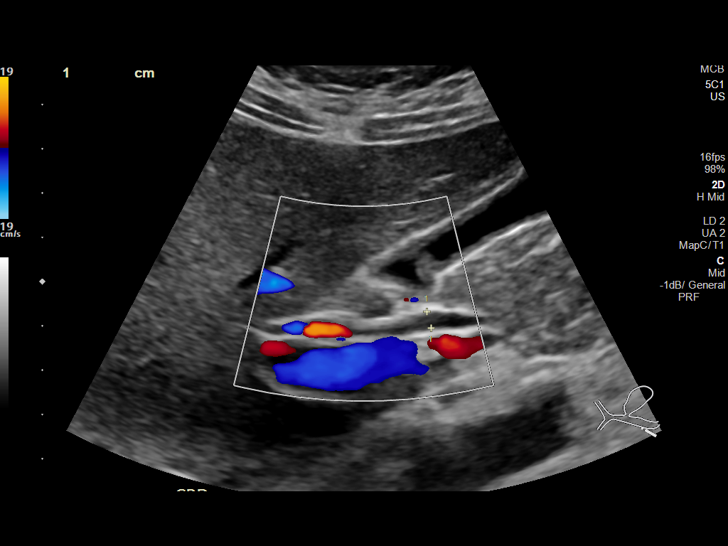
[im 20/54]
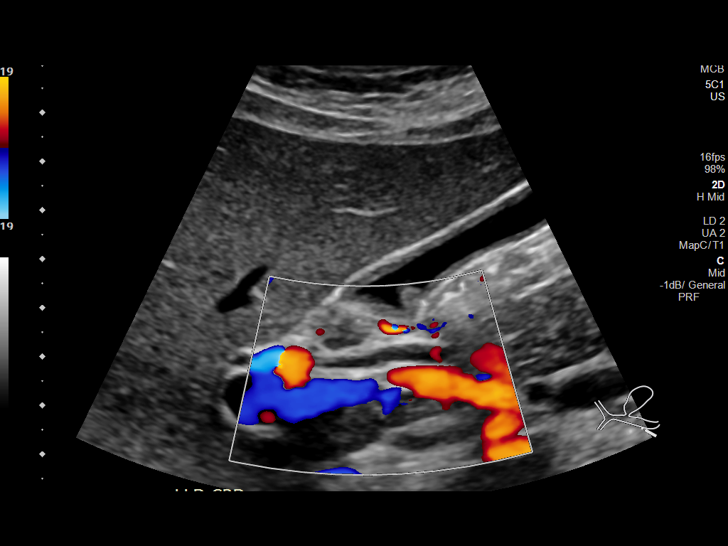
[im 25/54]
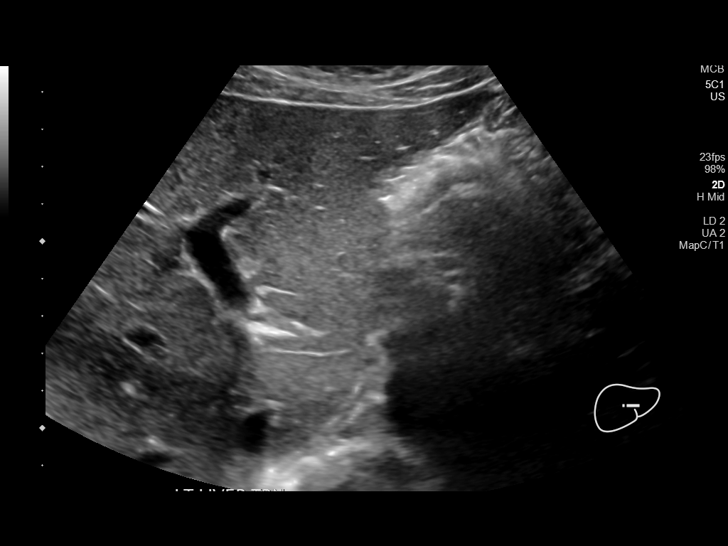
[im 29/54]
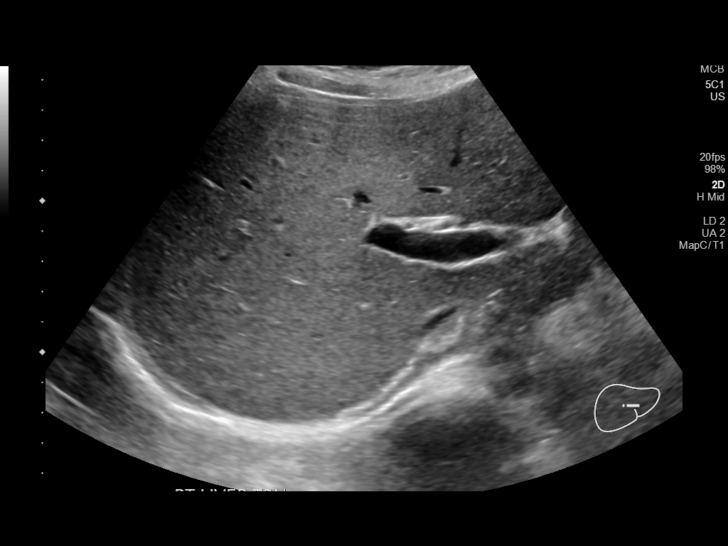
[im 34/54]
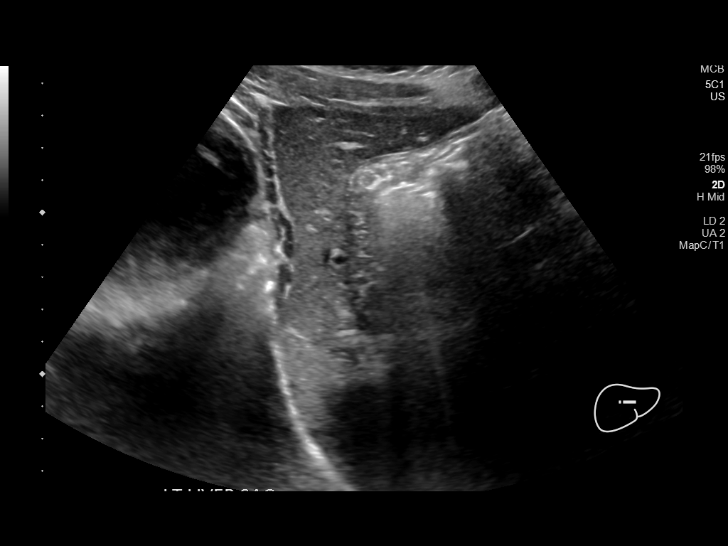
[im 36/54]
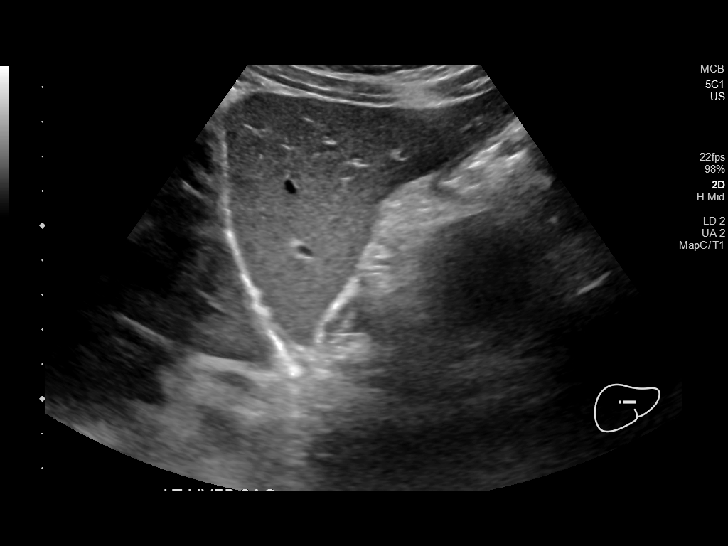
[im 40/54]
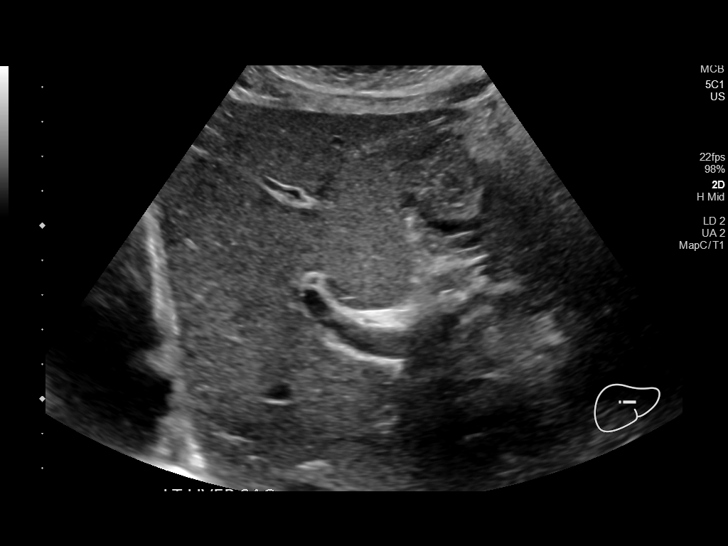
[im 45/54]
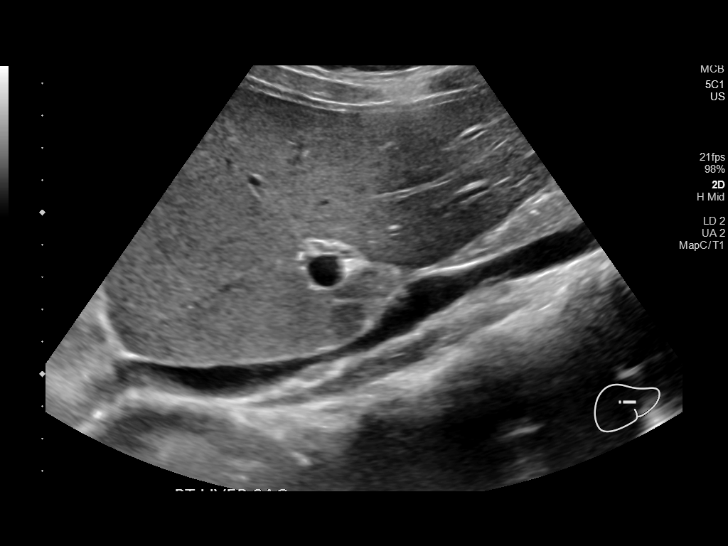
[im 49/54]
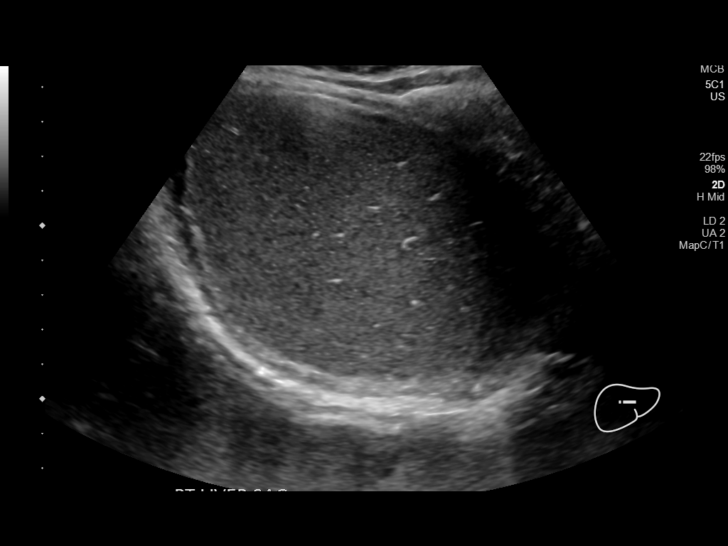
[im 54/54]
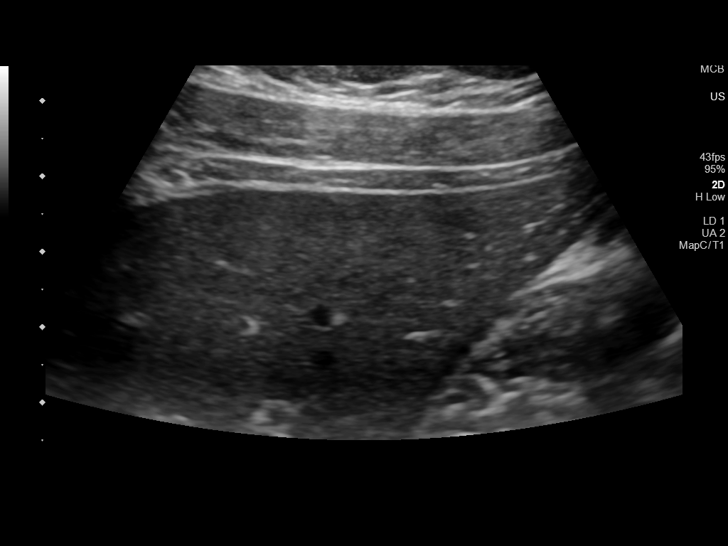

[14 of 25 positions shown; findings below may reference images not displayed]

FINDINGS: Gallbladder:

Gallbladder is somewhat contracted without gross thickening. No
gallstones evident. No pericholecystic fluid. No sonographic Murphy
sign noted by sonographer.

Common bile duct:

Diameter: 4 mm. No intrahepatic or extrahepatic biliary duct
dilatation.

Liver:

No focal lesion identified. Within normal limits in parenchymal
echogenicity. Portal vein is patent on color Doppler imaging with
normal direction of blood flow towards the liver.

Other: None.
IMPRESSION: 1. Gallbladder mildly contracted. Gallbladder wall is not frankly
thickened. No gallstones or pericholecystic fluid.

2.  Normal appearing liver by ultrasound.
